# Patient Record
Sex: Female | Born: 1965 | Race: White | Hispanic: No | Marital: Married | Smoking: Never smoker
Health system: Southern US, Community
[De-identification: ages and names within clinical notes are randomized; demographics above are authoritative.]

## PROBLEM LIST (undated history)

## (undated) DIAGNOSIS — I773 Arterial fibromuscular dysplasia: Secondary | ICD-10-CM

## (undated) HISTORY — PX: WISDOM TOOTH EXTRACTION: SHX21

---

## 1996-09-26 DIAGNOSIS — G459 Transient cerebral ischemic attack, unspecified: Secondary | ICD-10-CM

## 1996-09-26 HISTORY — DX: Transient cerebral ischemic attack, unspecified: G45.9

## 2005-07-14 ENCOUNTER — Encounter: Admission: RE | Admit: 2005-07-14 | Discharge: 2005-07-14 | Payer: Self-pay | Admitting: Family Medicine

## 2006-10-24 ENCOUNTER — Encounter: Admission: RE | Admit: 2006-10-24 | Discharge: 2006-10-24 | Payer: Self-pay | Admitting: Family Medicine

## 2006-11-09 ENCOUNTER — Ambulatory Visit: Payer: Self-pay | Admitting: Family Medicine

## 2007-04-24 ENCOUNTER — Encounter: Admission: RE | Admit: 2007-04-24 | Discharge: 2007-04-24 | Payer: Self-pay | Admitting: Nephrology

## 2007-05-22 ENCOUNTER — Ambulatory Visit (HOSPITAL_COMMUNITY): Admission: RE | Admit: 2007-05-22 | Discharge: 2007-05-22 | Payer: Self-pay | Admitting: Interventional Radiology

## 2007-05-31 ENCOUNTER — Encounter: Admission: RE | Admit: 2007-05-31 | Discharge: 2007-05-31 | Payer: Self-pay | Admitting: Interventional Radiology

## 2007-12-04 ENCOUNTER — Encounter: Admission: RE | Admit: 2007-12-04 | Discharge: 2007-12-04 | Payer: Self-pay | Admitting: Family Medicine

## 2008-12-24 ENCOUNTER — Encounter: Admission: RE | Admit: 2008-12-24 | Discharge: 2008-12-24 | Payer: Self-pay | Admitting: Unknown Physician Specialty

## 2009-08-19 ENCOUNTER — Ambulatory Visit (HOSPITAL_BASED_OUTPATIENT_CLINIC_OR_DEPARTMENT_OTHER): Admission: RE | Admit: 2009-08-19 | Discharge: 2009-08-19 | Payer: Self-pay | Admitting: Surgery

## 2010-03-22 ENCOUNTER — Encounter: Admission: RE | Admit: 2010-03-22 | Discharge: 2010-03-22 | Payer: Self-pay | Admitting: Internal Medicine

## 2010-12-29 LAB — POCT I-STAT, CHEM 8
BUN: 19 mg/dL (ref 6–23)
Calcium, Ion: 1.15 mmol/L (ref 1.12–1.32)
Chloride: 106 mEq/L (ref 96–112)
Creatinine, Ser: 0.9 mg/dL (ref 0.4–1.2)
Glucose, Bld: 92 mg/dL (ref 70–99)
HCT: 46 % (ref 36.0–46.0)
Hemoglobin: 15.6 g/dL — ABNORMAL HIGH (ref 12.0–15.0)
Potassium: 4.1 mEq/L (ref 3.5–5.1)
Sodium: 140 mEq/L (ref 135–145)
TCO2: 25 mmol/L (ref 0–100)

## 2011-02-15 ENCOUNTER — Other Ambulatory Visit: Payer: Self-pay | Admitting: Obstetrics & Gynecology

## 2011-02-15 DIAGNOSIS — Z1231 Encounter for screening mammogram for malignant neoplasm of breast: Secondary | ICD-10-CM

## 2011-03-28 ENCOUNTER — Ambulatory Visit
Admission: RE | Admit: 2011-03-28 | Discharge: 2011-03-28 | Disposition: A | Discharge status: Home / Self Care | Payer: Private Health Insurance - Indemnity | Source: Ambulatory Visit | Attending: Obstetrics & Gynecology | Admitting: Obstetrics & Gynecology

## 2011-03-28 DIAGNOSIS — Z1231 Encounter for screening mammogram for malignant neoplasm of breast: Secondary | ICD-10-CM

## 2011-07-08 LAB — BASIC METABOLIC PANEL
BUN: 17
CO2: 28
Calcium: 9.2
Chloride: 106
Creatinine, Ser: 0.81
GFR calc Af Amer: >60
GFR calc non Af Amer: >60
Glucose, Bld: 105 — ABNORMAL HIGH
Potassium: 4.1
Sodium: 139

## 2011-07-08 LAB — CBC
HCT: 39.1
Hemoglobin: 13.6
MCHC: 34.9
MCV: 92.8
Platelets: 305
RBC: 4.21
RDW: 11.9
WBC: 4.9

## 2011-07-08 LAB — PROTIME-INR
INR: 1
Prothrombin Time: 13.4

## 2012-03-19 ENCOUNTER — Other Ambulatory Visit: Payer: Self-pay | Admitting: Internal Medicine

## 2012-03-19 DIAGNOSIS — Z1231 Encounter for screening mammogram for malignant neoplasm of breast: Secondary | ICD-10-CM

## 2013-04-23 ENCOUNTER — Other Ambulatory Visit: Payer: Self-pay | Admitting: Obstetrics and Gynecology

## 2013-04-23 DIAGNOSIS — Z1231 Encounter for screening mammogram for malignant neoplasm of breast: Secondary | ICD-10-CM

## 2014-04-08 ENCOUNTER — Other Ambulatory Visit: Payer: Self-pay

## 2014-04-08 DIAGNOSIS — Z1231 Encounter for screening mammogram for malignant neoplasm of breast: Secondary | ICD-10-CM

## 2014-04-16 ENCOUNTER — Encounter (INDEPENDENT_AMBULATORY_CARE_PROVIDER_SITE_OTHER): Payer: Self-pay

## 2014-04-16 ENCOUNTER — Ambulatory Visit
Admission: RE | Admit: 2014-04-16 | Discharge: 2014-04-16 | Disposition: A | Discharge status: Home / Self Care | Payer: PRIVATE HEALTH INSURANCE | Source: Ambulatory Visit

## 2014-04-16 DIAGNOSIS — Z1231 Encounter for screening mammogram for malignant neoplasm of breast: Secondary | ICD-10-CM

## 2017-11-17 ENCOUNTER — Ambulatory Visit: Payer: PRIVATE HEALTH INSURANCE

## 2017-11-17 ENCOUNTER — Ambulatory Visit (INDEPENDENT_AMBULATORY_CARE_PROVIDER_SITE_OTHER): Payer: PRIVATE HEALTH INSURANCE | Admitting: Podiatry

## 2017-11-17 DIAGNOSIS — M6740 Ganglion, unspecified site: Secondary | ICD-10-CM

## 2017-11-17 DIAGNOSIS — R52 Pain, unspecified: Secondary | ICD-10-CM

## 2017-11-17 NOTE — Progress Notes (Signed)
   Subjective:    Patient ID: Mariah RombergLeslie F Cozby, female    DOB: 11/20/1965, 52 y.o.   MRN: [id number]  HPI    Review of Systems  All other systems reviewed and are negative.      Objective:   Physical Exam        Assessment & Plan:

## 2017-11-19 NOTE — Progress Notes (Signed)
   HPI: 52 year old female presenting today as a new patient with a chief complaint of a painful nodule to the dorsum of the left foot that has been present for 6 months. Applying pressure to the area increases the pain. She has not done anything to treat the symptoms. Patient is here for further evaluation and treatment.   No past medical history on file.   Physical Exam: General: The patient is alert and oriented x3 in no acute distress.  Dermatology: Skin is warm, dry and supple bilateral lower extremities. Negative for open lesions or macerations.  Vascular: Palpable pedal pulses bilaterally. No edema or erythema noted. Capillary refill within normal limits.  Neurological: Epicritic and protective threshold grossly intact bilaterally.   Musculoskeletal Exam: Fluctuant soft tissue mass noted to dorsum of the left foot just overlying the EHL tendon. Range of motion within normal limits to all pedal and ankle joints bilateral. Muscle strength 5/5 in all groups bilateral.   Assessment: - ganglion cyst left foot   Plan of Care:  - Patient evaluated. X-Ray declined by patient.  - Foot was prepped in an aseptic manner.  - Aspiration performed after local anesthesia infiltration consisting of 3 mLs of Lidocaine 2% with an 18 gauge needle.  - Compressive dressing applied.  - Resume full activity with no restrictions. Explained that the cyst has a high chance of recurrence.  - Compression anklet dispensed.  - Return to clinic as needed.    Felecia ShellingBrent M. Nykira Reddix, DPM [facility]  Dr. Felecia ShellingBrent M. Gennell How, DPM    [address]                                        [place], [place] [address]                Office [phone]) [phone]  Fax [phone](336) [phone]

## 2018-01-30 ENCOUNTER — Ambulatory Visit: Payer: PRIVATE HEALTH INSURANCE | Admitting: Podiatry

## 2018-02-02 ENCOUNTER — Ambulatory Visit (INDEPENDENT_AMBULATORY_CARE_PROVIDER_SITE_OTHER): Payer: 59 | Admitting: Podiatry

## 2018-02-02 ENCOUNTER — Encounter

## 2018-02-02 DIAGNOSIS — M6740 Ganglion, unspecified site: Secondary | ICD-10-CM

## 2018-02-05 NOTE — Progress Notes (Signed)
   HPI: 52 year old female presenting today for follow up evaluation of a ganglion cyst of the left foot. She states the cyst has now returned and is interested in aspiration. She has not done anything to treat the symptoms. There are no modifying factors. Patient is here for further evaluation and treatment.   No past medical history on file.   Physical Exam: General: The patient is alert and oriented x3 in no acute distress.  Dermatology: Skin is warm, dry and supple bilateral lower extremities. Negative for open lesions or macerations.  Vascular: Palpable pedal pulses bilaterally. No edema or erythema noted. Capillary refill within normal limits.  Neurological: Epicritic and protective threshold grossly intact bilaterally.   Musculoskeletal Exam: Fluctuant soft tissue mass noted to dorsum of the left foot just overlying the EHL tendon. Range of motion within normal limits to all pedal and ankle joints bilateral. Muscle strength 5/5 in all groups bilateral.   Assessment: - ganglion cyst left foot - recurred    Plan of Care:  - Patient evaluated. X-Ray declined by patient.  - Injection of 0.5 mLs Celestone Soluspan injected into the ganglion cyst of the left foot. - Foot was prepped in an aseptic manner.  - Aspiration performed after local anesthesia infiltration consisting of 3 mLs of Lidocaine 2% with an 18 gauge needle.  - Compressive dressing applied.  - Resume full activity with no restrictions. Explained that the cyst has a high chance of recurrence.  - Compression anklet dispensed.  - Return to clinic as needed.   Her insurance does not cover surgery. She is hesitant to have it surgically removed.    Felecia Shelling, DPM [facility]  Dr. Felecia Shelling, DPM    [address] [place], [place] [address]                Office [phone]  Fax [phone]

## 2018-03-19 ENCOUNTER — Ambulatory Visit (INDEPENDENT_AMBULATORY_CARE_PROVIDER_SITE_OTHER): Payer: 59 | Admitting: Podiatry

## 2018-03-19 DIAGNOSIS — M6740 Ganglion, unspecified site: Secondary | ICD-10-CM

## 2018-03-21 ENCOUNTER — Ambulatory Visit: Payer: 59 | Admitting: Podiatry

## 2018-03-23 NOTE — Progress Notes (Signed)
   HPI: 52 year old female presenting today for follow up evaluation of a ganglion cyst of the left foot. She states the cyst has now returned. She is concerned because she is to go on vacation soon and will be doing a lot of walking. She denies any current pain. Patient is here for further evaluation and treatment.   No past medical history on file.   Physical Exam: General: The patient is alert and oriented x3 in no acute distress.  Dermatology: Skin is warm, dry and supple bilateral lower extremities. Negative for open lesions or macerations.  Vascular: Palpable pedal pulses bilaterally. No edema or erythema noted. Capillary refill within normal limits.  Neurological: Epicritic and protective threshold grossly intact bilaterally.   Musculoskeletal Exam: Fluctuant soft tissue mass noted to dorsum of the left foot just overlying the EHL tendon. Range of motion within normal limits to all pedal and ankle joints bilateral. Muscle strength 5/5 in all groups bilateral.   Assessment: - ganglion cyst left foot - recurred    Plan of Care:  1. Patient evaluated.  2. Pressure applied and cyst ruptured/reabsorbed into soft tissue.  3. Continue wearing compression anklet.  4. At one point, patient may need surgery, but she has financial concerns.  5. Return to clinic as needed.    Her insurance does not cover surgery. She is hesitant to have it surgically removed.    Felecia ShellingBrent M. Luqman Perrelli, DPM [facility]  Dr. Felecia ShellingBrent M. Maylynn Orzechowski, DPM    [address]                                        [place], [place] [address]                Office [phone](336) [phone]  Fax [phone](336) [phone]

## 2020-06-16 ENCOUNTER — Ambulatory Visit
Admission: RE | Admit: 2020-06-16 | Discharge: 2020-06-16 | Disposition: A | Discharge status: Home / Self Care | Payer: BLUE CROSS/BLUE SHIELD | Source: Ambulatory Visit | Attending: Emergency Medicine | Admitting: Emergency Medicine

## 2020-06-16 ENCOUNTER — Other Ambulatory Visit: Payer: Self-pay

## 2020-06-16 VITALS — BP 123/79 | HR 85 | Temp 98.4°F | Resp 18

## 2020-06-16 DIAGNOSIS — U071 COVID-19: Secondary | ICD-10-CM | POA: Diagnosis not present

## 2020-06-16 DIAGNOSIS — R0981 Nasal congestion: Secondary | ICD-10-CM

## 2020-06-16 HISTORY — DX: Arterial fibromuscular dysplasia: I77.3

## 2020-06-16 MED ORDER — BENZONATATE 100 MG PO CAPS: 100.0000 mg | ORAL_CAPSULE | Freq: Three times a day (TID) | ORAL | 0 refills | Status: AC | PRN

## 2020-06-16 NOTE — ED Provider Notes (Signed)
Mariah Hernandez    CSN: [id number] Arrival date & time: 06/16/20  1015      History   Chief Complaint Chief Complaint  Patient presents with   Sinus Problem    HPI Mariah Hernandez is a 54 y.o. female.   Patient presents with sinus pressure and congestion x1 day.  She tested COVID positive at home on 06/10/2020; symptom onset 06/07/2020.  She reports nonproductive cough and intermittent diarrhea.  No diarrhea today.  She is able to eat and drink without difficulty and feels she is able to keep yourself hydrated at home.  She denies fever, sore throat, shortness of breath, vomiting, rash, or other symptoms.  Patient received the [facility] COVID vaccine.  The history is provided by the patient.    Past Medical History:  Diagnosis Date   Fibromuscular dysplasia (HCC)    TIA (transient ischemic attack) 1998    There are no problems to display for this patient.   Past Surgical History:  Procedure Laterality Date   WISDOM TOOTH EXTRACTION      OB History   No obstetric history on file.      Home Medications    Prior to Admission medications   Medication Sig Start Date End Date Taking? Authorizing Provider  benzonatate (TESSALON) 100 MG capsule Take 1 capsule (100 mg total) by mouth 3 (three) times daily as needed for cough. 06/16/20   Mickie Bail, NP    Family History History reviewed. No pertinent family history.  Social History Social History   Tobacco Use   Smoking status: Never Smoker   Smokeless tobacco: Never Used  Substance Use Topics   Alcohol use: Yes    Comment: occasionally   Drug use: Not on file     Allergies   Amoxicillin, Demerol [meperidine hcl], and Stadol [butorphanol]   Review of Systems Review of Systems  Constitutional: Negative for chills and fever.  HENT: Positive for congestion and sinus pressure. Negative for ear pain and sore throat.   Eyes: Negative for pain and visual disturbance.  Respiratory:  Positive for cough. Negative for shortness of breath.   Cardiovascular: Negative for chest pain and palpitations.  Gastrointestinal: Positive for diarrhea. Negative for abdominal pain and vomiting.  Genitourinary: Negative for dysuria and hematuria.  Musculoskeletal: Negative for arthralgias and back pain.  Skin: Negative for color change and rash.  Neurological: Negative for seizures and syncope.  All other systems reviewed and are negative.    Physical Exam Triage Vital Signs ED Triage Vitals [06/16/20 1025]  Enc Vitals Group     BP      Pulse      Resp      Temp      Temp src      SpO2      Weight      Height      Head Circumference      Peak Flow      Pain Score 2     Pain Loc      Pain Edu?      Excl. in GC?    No data found.  Updated Vital Signs BP 123/79    Pulse 85    Temp 98.4 F (36.9 C)    Resp 18    SpO2 98%   Visual Acuity Right Eye Distance:   Left Eye Distance:   Bilateral Distance:    Right Eye Near:   Left Eye Near:    Bilateral Near:  Physical Exam Vitals and nursing note reviewed.  Constitutional:      General: She is not in acute distress.    Appearance: She is well-developed. She is not ill-appearing.  HENT:     Head: Normocephalic and atraumatic.     Right Ear: Tympanic membrane normal.     Left Ear: Tympanic membrane normal.     Nose: Congestion present.     Mouth/Throat:     Mouth: Mucous membranes are moist.     Pharynx: Oropharynx is clear.  Eyes:     Conjunctiva/sclera: Conjunctivae normal.  Cardiovascular:     Rate and Rhythm: Normal rate and regular rhythm.     Heart sounds: No murmur heard.   Pulmonary:     Effort: Pulmonary effort is normal. No respiratory distress.     Breath sounds: Normal breath sounds.  Abdominal:     Palpations: Abdomen is soft.     Tenderness: There is no abdominal tenderness. There is no guarding or rebound.  Musculoskeletal:     Cervical back: Neck supple.  Skin:    General: Skin is  warm and dry.     Findings: No rash.  Neurological:     General: No focal deficit present.     Mental Status: She is alert and oriented to person, place, and time.     Gait: Gait normal.  Psychiatric:        Mood and Affect: Mood normal.        Behavior: Behavior normal.      UC Treatments / Results  Labs (all labs ordered are listed, but only abnormal results are displayed) Labs Reviewed - No data to display  EKG   Radiology No results found.  Procedures Procedures (including critical care time)  Medications Ordered in UC Medications - No data to display  Initial Impression / Assessment and Plan / UC Course  I have reviewed the triage vital signs and the nursing notes.  Pertinent labs & imaging results that were available during my care of the patient were reviewed by me and considered in my medical decision making (see chart for details).   COVID-19.  Sinus congestion.  Treating with Mucinex, Tessalon Perles, Tylenol or ibuprofen, rest, hydration.  Instructed patient to continue her self quarantine per CDC guidelines.  Instructed her to go to the ED if she has acute worsening symptoms.  Instructed her to follow-up with her PCP if her sinus congestion is not improving.  Patient agrees to plan of care.   Final Clinical Impressions(s) / UC Diagnoses   Final diagnoses:  COVID-19  Sinus congestion     Discharge Instructions     Take plain over-the-counter Mucinex as directed.  Take the [facility] as needed for cough.  Take Tylenol or ibuprofen as needed for fever or discomfort.  You should self-quarantine for:  *10 days since your symptoms first appeared and  *24 hours with no fever, without the use of fever-reducing medications and  *your other symptoms of COVID are improving.  Most people do not need to be re-tested at the end of the quarantine period.    Go to the emergency department if you have high fever not relieved by Tylenol, shortness of breath,  severe diarrhea, or other concerning symptoms.       ED Prescriptions    Medication Sig Dispense Auth. Provider   benzonatate (TESSALON) 100 MG capsule Take 1 capsule (100 mg total) by mouth 3 (three) times daily as needed for cough. 21  capsule Mickie Bail, NP     PDMP not reviewed this encounter.   Mickie Bail, NP 06/16/20 1054

## 2020-06-16 NOTE — ED Triage Notes (Signed)
Patient reports that she did an at home covid test on 9/15 which was positive. Reports that she is having sinus pressure that is worse when she bends over.

## 2020-06-16 NOTE — Discharge Instructions (Signed)
Take plain over-the-counter Mucinex as directed.  Take the [facility] as needed for cough.  Take Tylenol or ibuprofen as needed for fever or discomfort.  You should self-quarantine for:  *10 days since your symptoms first appeared and  *24 hours with no fever, without the use of fever-reducing medications and  *your other symptoms of COVID are improving.  Most people do not need to be re-tested at the end of the quarantine period.    Go to the emergency department if you have high fever not relieved by Tylenol, shortness of breath, severe diarrhea, or other concerning symptoms.

## 2021-03-30 ENCOUNTER — Ambulatory Visit: Payer: BLUE CROSS/BLUE SHIELD | Admitting: Occupational Therapy

## 2021-04-20 ENCOUNTER — Ambulatory Visit: Payer: BLUE CROSS/BLUE SHIELD | Admitting: Occupational Therapy

## 2021-04-21 ENCOUNTER — Ambulatory Visit: Payer: BLUE CROSS/BLUE SHIELD | Attending: Internal Medicine | Admitting: Occupational Therapy

## 2021-04-21 DIAGNOSIS — M25642 Stiffness of left hand, not elsewhere classified: Secondary | ICD-10-CM | POA: Insufficient documentation

## 2021-04-21 NOTE — Therapy (Signed)
[place] [facility]INE [address], [place], [address] Phone: [phone]   Fax:  [phone]  Occupational Therapy Screen:  Patient Details  Name: Mariah Hernandez MRN: [id number] Date of Birth: 1965-12-25 No data recorded  Encounter Date: 04/21/2021   OT End of Session - 04/21/21 1607     Visit Number 0             Past Medical History:  Diagnosis Date   Fibromuscular dysplasia (HCC)    TIA (transient ischemic attack) 1998    Past Surgical History:  Procedure Laterality Date   WISDOM TOOTH EXTRACTION      There were no vitals filed for this visit.   Subjective Assessment - 04/21/21 1605     Subjective  I cut my finger in Febr with hedge cutter -and it was deep but did clean it and did some sterristrips- and kept it  straight for about month in something like splint - but now it is soooo stiff and cannot use my index finger - and get red at times               [facility] OT Assessment - 04/21/21 0001       Left Hand AROM   L Index  MCP 0-90 95 Degrees    L Index PIP 0-100 75 Degrees   blocked intrinsic 60 , increase to 90 in session   L Index DIP 0-70 --   no ext notice , keep in slight flexion 25 degrees                Pt arrive with hx of cut to dorsal DIP of 2nd digit- L hand in Febr 22 - pt cleaned it and sterristrips -and then kept it in splint of something for month  Now with severe stiffness at DIP flexion - and coming in at PIP  Scar across - and adhere - pt ed on scar massage  And then PROM block to DIP  Intrinsic fist - DIP/PIP stretch 30 sec x 3  Followed by block intrinsic fist   Composite fist with pen in hand at [facility] - to block MC at 90 10 reps  3-5 x day - To do for 2 wks and follow up if needed                                   Visit Diagnosis: Stiffness of left hand, not elsewhere classified    Problem List There are no problems to  display for this patient.   Oletta Cohn OTR/L,CLT 04/21/2021, 4:08 PM  [place] [facility] AND SPORTS MEDICINE [address], [place], [address] Phone: [phone]   Fax:  [phone]  Name: DALAYSIA HARMS MRN: [id number] Date of Birth: [birth year]

## 2021-05-05 ENCOUNTER — Ambulatory Visit: Payer: BLUE CROSS/BLUE SHIELD | Attending: Internal Medicine | Admitting: Occupational Therapy

## 2021-11-25 ENCOUNTER — Ambulatory Visit: Payer: 59 | Admitting: Obstetrics and Gynecology

## 2022-02-01 ENCOUNTER — Other Ambulatory Visit: Payer: Self-pay | Admitting: Family

## 2022-02-01 DIAGNOSIS — Z1231 Encounter for screening mammogram for malignant neoplasm of breast: Secondary | ICD-10-CM

## 2022-03-07 ENCOUNTER — Ambulatory Visit
Admission: RE | Admit: 2022-03-07 | Discharge: 2022-03-07 | Disposition: A | Discharge status: Home / Self Care | Payer: BLUE CROSS/BLUE SHIELD | Source: Ambulatory Visit | Attending: Family | Admitting: Family

## 2022-03-07 DIAGNOSIS — Z1231 Encounter for screening mammogram for malignant neoplasm of breast: Secondary | ICD-10-CM | POA: Insufficient documentation

## 2022-10-13 IMAGING — MG DIGITAL SCREENING BREAST BILAT IMPLANT W/ TOMO W/ CAD
8 of 14 series · 8 of 34 positions shown · non-contrast
Comparison: Previous exam(s).

CLINICAL DATA: Screening.

EXAM:
DIGITAL SCREENING BILATERAL MAMMOGRAM WITH IMPLANTS, CAD AND
TOMOSYNTHESIS
TECHNIQUE: Bilateral screening digital craniocaudal and mediolateral oblique
mammograms were obtained. Bilateral screening digital breast
tomosynthesis was performed. The images were evaluated with
computer-aided detection. Standard and/or implant displaced views
were performed.

[R MLO]
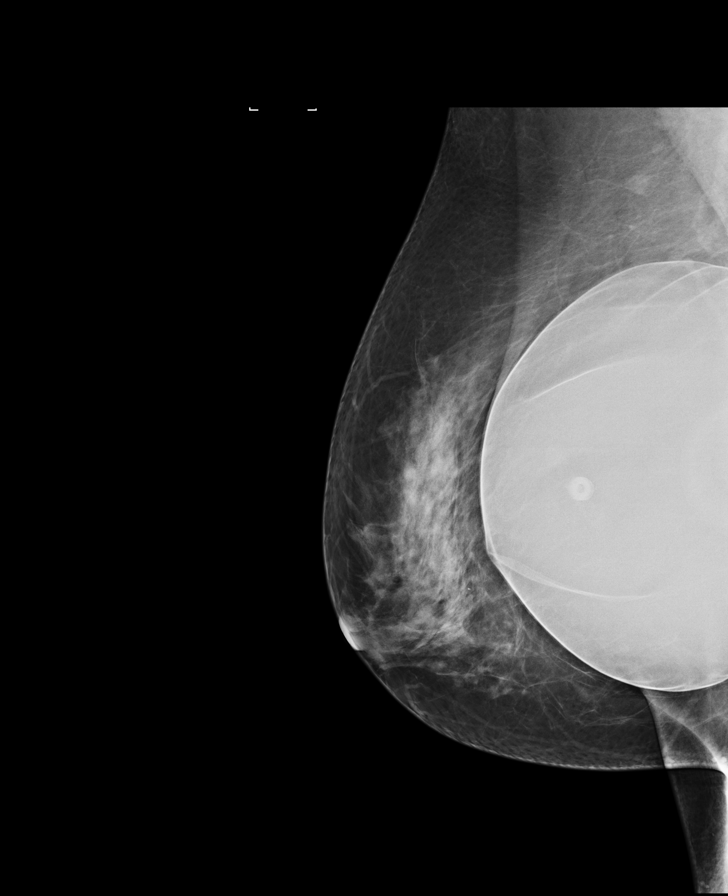

[L MLO]
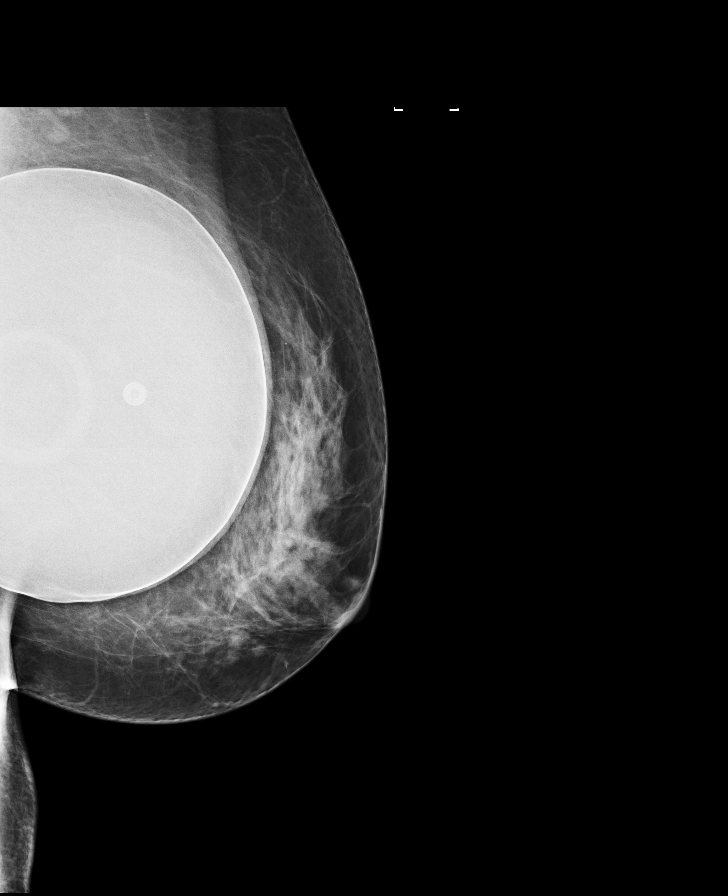

[R CC]
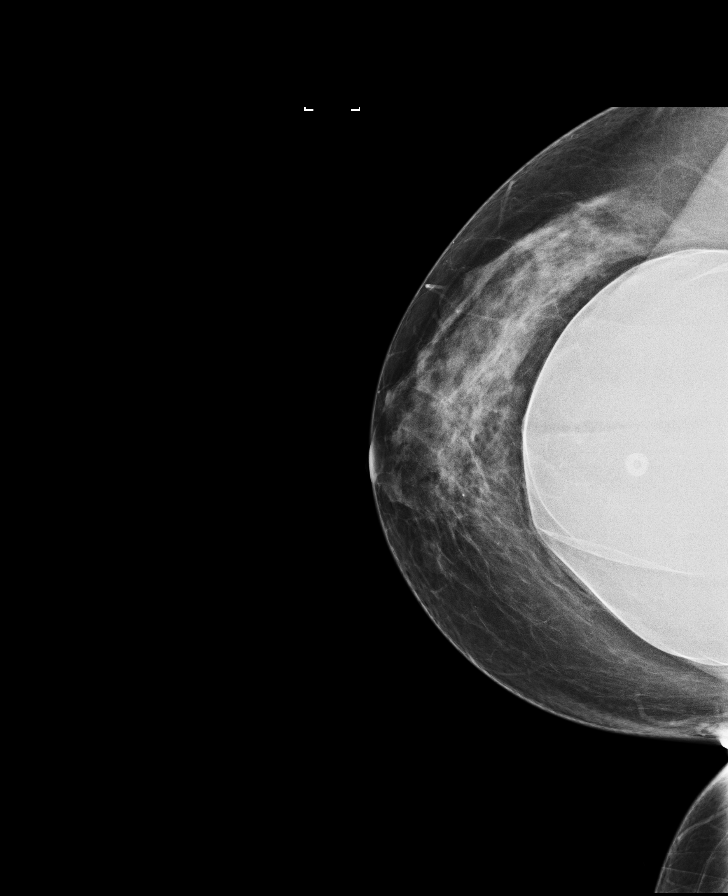

[L CC]
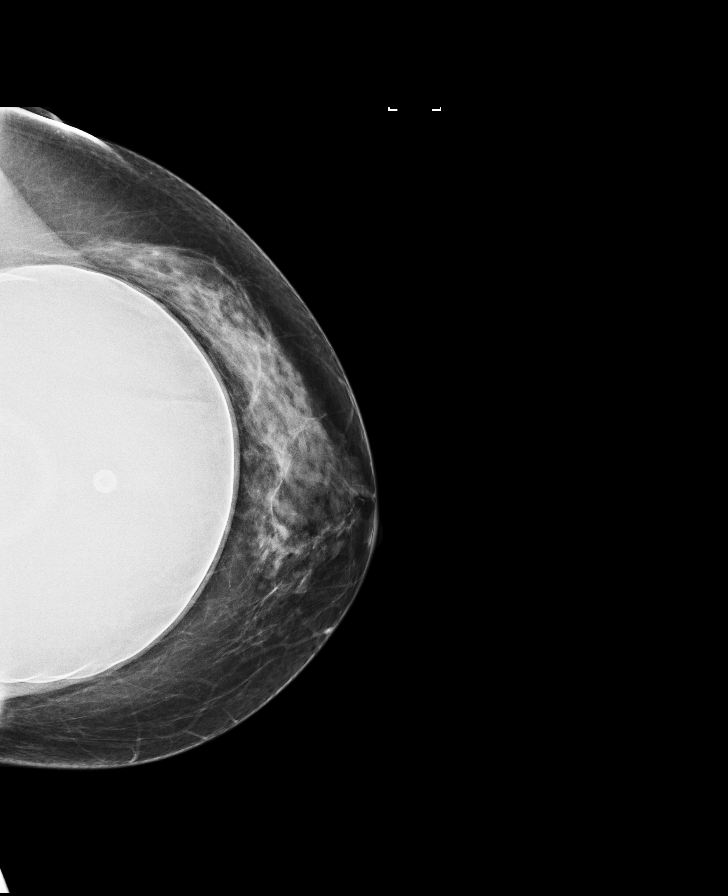

[L CC synth-2D]
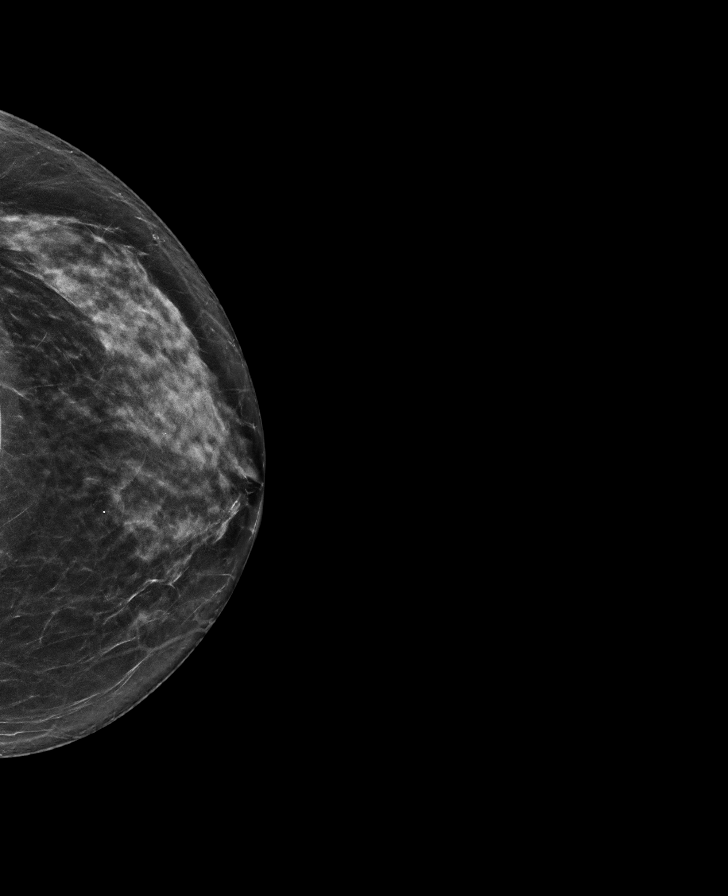

[R MLO synth-2D]
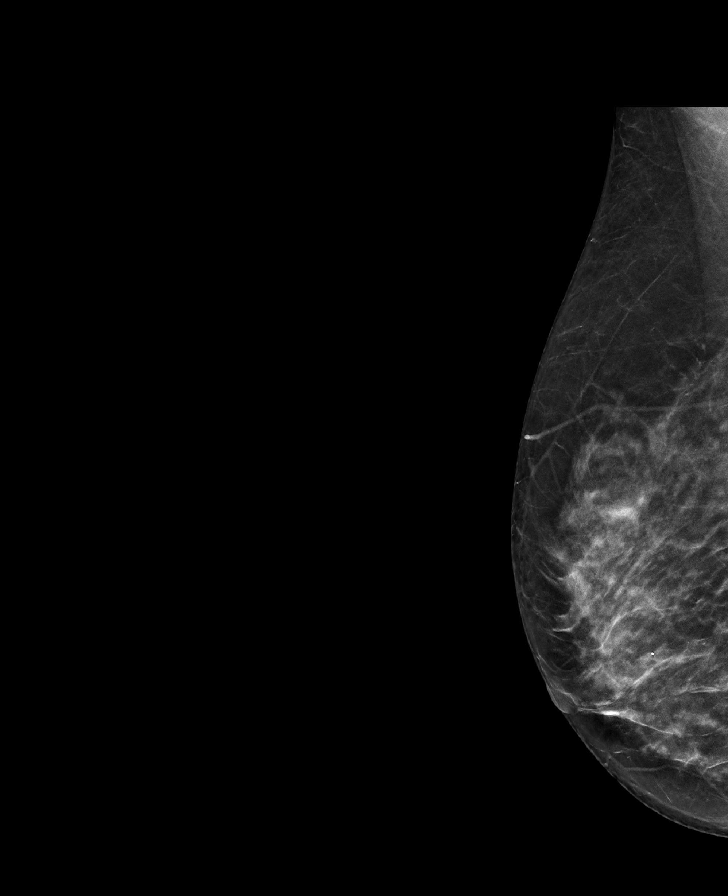

[L MLO synth-2D]
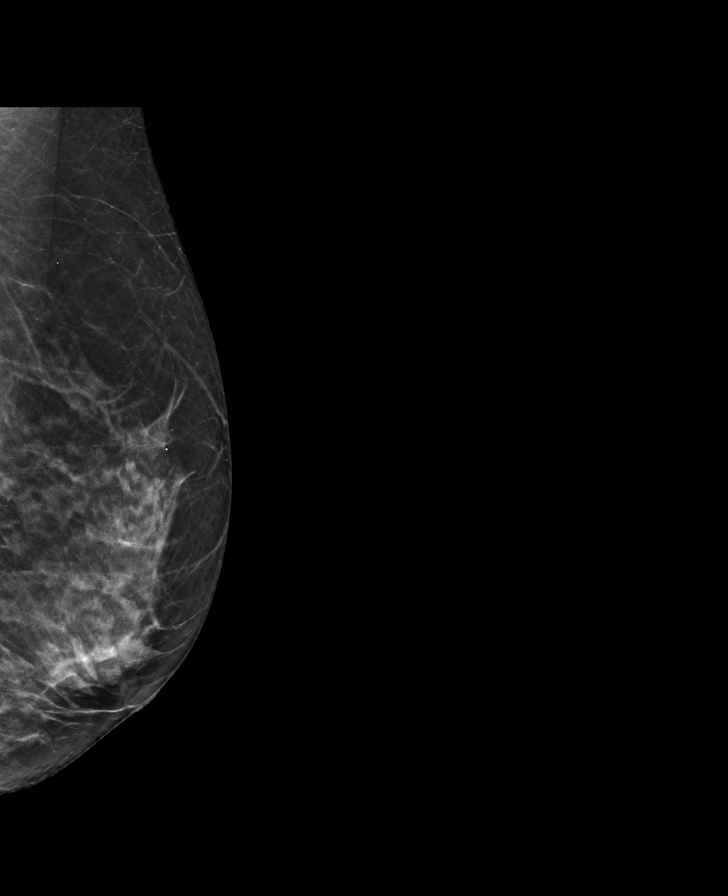

[R CC synth-2D]
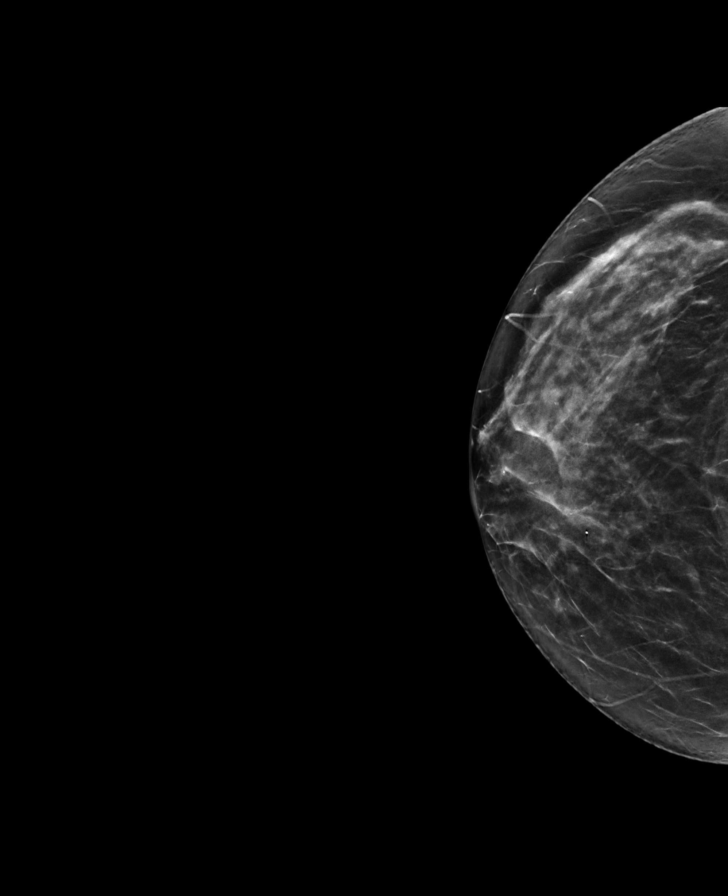

[8 of 34 positions shown; findings below may reference images not displayed]

ACR Breast Density Category c: The breast tissue is heterogeneously
dense, which may obscure small masses.
FINDINGS: The patient has retropectoral implants. There are no findings
suspicious for malignancy.
IMPRESSION: No mammographic evidence of malignancy. A result letter of this
screening mammogram will be mailed directly to the patient.

RECOMMENDATION:
Screening mammogram in one year. (Code:LT-E-7TH)

BI-RADS CATEGORY  1:  Negative.

## 2023-08-18 ENCOUNTER — Ambulatory Visit: Payer: BLUE CROSS/BLUE SHIELD | Admitting: Obstetrics

## 2023-08-18 ENCOUNTER — Encounter: Payer: Self-pay | Admitting: Obstetrics

## 2023-08-18 VITALS — BP 130/80 | HR 92 | Ht 66.0 in | Wt 176.0 lb

## 2023-08-18 DIAGNOSIS — N898 Other specified noninflammatory disorders of vagina: Secondary | ICD-10-CM | POA: Diagnosis not present

## 2023-08-18 DIAGNOSIS — L309 Dermatitis, unspecified: Secondary | ICD-10-CM

## 2023-08-18 MED ORDER — CLOBETASOL PROPIONATE 0.05 % EX OINT: 1.0000 | TOPICAL_OINTMENT | Freq: Two times a day (BID) | CUTANEOUS | 1 refills | Status: AC

## 2023-08-18 NOTE — Progress Notes (Signed)
    GYNECOLOGY PROGRESS NOTE  Subjective:  PCP: Pcp, No  Patient ID: Mariah Hernandez, female    DOB: September 18, 1966, 57 y.o.   MRN: [id number]  HPI  Patient is a 57 y.o. G2P2 female who presents for vulvar irritation, burning, red, itchy skin, also a white small bump at the top left labia. Redness/irritation go all the way to her buttocks. This comes and goes for the past several years, guessing approx 66yrs. Episodes will last a few days, but this current time is lasting 1 week. She uses coconut oil and other essential oils and feels that helps her symptoms. Is finally tired of having these episodes come and go so decided to come in for eval. States she usually sees [facility], but unavailable today.   The following portions of the patient's history were reviewed and updated as appropriate: allergies, current medications, past family history, past medical history, past social history, past surgical history, and problem list.  Review of Systems Pertinent items are noted in HPI.   Objective:   Blood pressure 130/80, pulse 92, height 5\' 6"  (1.676 m), weight 176 lb (79.8 kg). Body mass index is 28.41 kg/m.  General appearance: alert and cooperative Abdomen: soft, non-tender; bowel sounds normal; no masses,  no organomegaly Respiratory: normal work of breathing  Pelvic: external genitalia with white, atrophic patches bilaterally near clitoris; erythematous patches extending into the posterior fourchette; perineum/buttocks with well-demarcated, erythematous patches with flaking, white borders, papery appearance. See images in chart.  Extremities: extremities normal, atraumatic, no cyanosis or edema Neurologic: Grossly normal  Assessment/Plan:   1. Vulvar dermatitis   2. Vaginal itching    Based on appearance and symptoms, suspect lichen sclerosus.  -Trial Clobetasol BID  -Discussed hygiene and clothing precautions -May need biopsy in future, this was discussed, pt aware. -RTC 4wks for  progress eval, sooner prn   Julieanne Manson, DO [facility]

## 2023-09-08 NOTE — Progress Notes (Deleted)
    GYNECOLOGY PROGRESS NOTE  Subjective:  PCP: Pcp, No  Patient ID: Mariah Hernandez, female    DOB: 09-Nov-1965, 57 y.o.   MRN: [id number]  HPI Patient is a 57 y.o. G2P2 female who presents for follow up on suspect lichen sclerosus, she was put on Trial Clobetasol BID . Was told to follow up in 4 weeks.    {Common ambulatory SmartLinks:19316}  Review of Systems {ros; complete:30496}   Objective:   There were no vitals taken for this visit. There is no height or weight on file to calculate BMI.  General appearance: {general exam:16600} Abdomen: {abdominal exam:16834} Pelvic: {pelvic exam:16852::"cervix normal in appearance","external genitalia normal","no adnexal masses or tenderness","no cervical motion tenderness","rectovaginal septum normal","uterus normal size, shape, and consistency","vagina normal without discharge"} Extremities: {extremity exam:5109} Neurologic: {neuro exam:17854}   Assessment/Plan:   No diagnosis found.   There are no diagnoses linked to this encounter.     Julieanne Manson, DO [facility]

## 2023-09-12 ENCOUNTER — Ambulatory Visit: Payer: BLUE CROSS/BLUE SHIELD | Admitting: Obstetrics

## 2023-09-29 NOTE — Progress Notes (Deleted)
    GYNECOLOGY PROGRESS NOTE  Subjective:  PCP: Pcp, No  Patient ID: Mariah Hernandez, female    DOB: 03-12-1966, 58 y.o.   MRN: [id number]  HPI  Patient is a 58 y.o. G2P2 female who presents for follow up on lichen sclerosus. Pt was prescribed Clobetasol  BID. Pt states that   {Common ambulatory SmartLinks:19316}  Review of Systems {ros; complete:30496}   Objective:   There were no vitals taken for this visit. There is no height or weight on file to calculate BMI.  General appearance: {general exam:16600} Abdomen: {abdominal exam:16834} Pelvic: {pelvic exam:16852::cervix normal in appearance,external genitalia normal,no adnexal masses or tenderness,no cervical motion tenderness,rectovaginal septum normal,uterus normal size, shape, and consistency,vagina normal without discharge} Extremities: {extremity exam:5109} Neurologic: {neuro exam:17854}   Assessment/Plan:   No diagnosis found.   There are no diagnoses linked to this encounter.     Estil Mangle, DO [facility]

## 2023-10-02 ENCOUNTER — Ambulatory Visit: Payer: BLUE CROSS/BLUE SHIELD | Admitting: Obstetrics

## 2023-10-06 NOTE — Progress Notes (Deleted)
    GYNECOLOGY PROGRESS NOTE  Subjective:  PCP: Pcp, No  Patient ID: Mariah Hernandez, female    DOB: December 22, 1965, 58 y.o.   MRN: [id number]  HPI  Patient is a 58 y.o. G2P2 female who presents for follow up from 08/18/23 for Vulvar Dermatitis and Vaginal Itching, she was advised to use Clobetasol  BID at the last visit.    {Common ambulatory SmartLinks:19316}  Review of Systems {ros; complete:30496}   Objective:   There were no vitals taken for this visit. There is no height or weight on file to calculate BMI.  General appearance: {general exam:16600} Abdomen: {abdominal exam:16834} Pelvic: {pelvic exam:16852::cervix normal in appearance,external genitalia normal,no adnexal masses or tenderness,no cervical motion tenderness,rectovaginal septum normal,uterus normal size, shape, and consistency,vagina normal without discharge} Extremities: {extremity exam:5109} Neurologic: {neuro exam:17854}   Assessment/Plan:   No diagnosis found.   There are no diagnoses linked to this encounter.     Estil Mangle, DO [facility]

## 2023-10-09 ENCOUNTER — Ambulatory Visit: Payer: BLUE CROSS/BLUE SHIELD | Admitting: Obstetrics

## 2023-10-16 NOTE — Progress Notes (Deleted)
    GYNECOLOGY PROGRESS NOTE  Subjective:  PCP: Pcp, No  Patient ID: Mariah Hernandez, female    DOB: Feb 21, 1966, 58 y.o.   MRN: [id number]  HPI  Patient is a 58 y.o. G2P2 female who presents for follow up from Vulvar dermatitis, pt was advised to use  Clobetasol BID.   {Common ambulatory SmartLinks:19316}  Review of Systems {ros; complete:30496}   Objective:   There were no vitals taken for this visit. There is no height or weight on file to calculate BMI.  General appearance: {general exam:16600} Abdomen: {abdominal exam:16834} Pelvic: {pelvic exam:16852::"cervix normal in appearance","external genitalia normal","no adnexal masses or tenderness","no cervical motion tenderness","rectovaginal septum normal","uterus normal size, shape, and consistency","vagina normal without discharge"} Extremities: {extremity exam:5109} Neurologic: {neuro exam:17854}   Assessment/Plan:   No diagnosis found.   There are no diagnoses linked to this encounter.     Julieanne Manson, DO [facility]

## 2023-10-17 ENCOUNTER — Ambulatory Visit: Payer: BLUE CROSS/BLUE SHIELD | Admitting: Obstetrics

## 2023-10-17 DIAGNOSIS — L309 Dermatitis, unspecified: Secondary | ICD-10-CM

## 2023-10-20 NOTE — Progress Notes (Deleted)
   GYNECOLOGY PROGRESS NOTE  Subjective:  PCP: Pcp, No  Patient ID: Mariah Hernandez, female    DOB: July 12, 1966, 58 y.o.   MRN: [id number]  HPI  Patient is a 58 y.o. G2P2 female who presents for follow up from Vulvar dermatitis, pt was advised to use  Clobetasol BID.   {Common ambulatory SmartLinks:19316}  Review of Systems {ros; complete:30496}   Objective:   There were no vitals taken for this visit. There is no height or weight on file to calculate BMI.  General appearance: {general exam:16600} Abdomen: {abdominal exam:16834} Pelvic: {pelvic exam:16852::"cervix normal in appearance","external genitalia normal","no adnexal masses or tenderness","no cervical motion tenderness","rectovaginal septum normal","uterus normal size, shape, and consistency","vagina normal without discharge"} Extremities: {extremity exam:5109} Neurologic: {neuro exam:17854}   Assessment/Plan:   No diagnosis found.   There are no diagnoses linked to this encounter.     Julieanne Manson, DO [facility]

## 2023-10-23 ENCOUNTER — Ambulatory Visit: Payer: 59 | Admitting: Obstetrics

## 2023-10-23 DIAGNOSIS — N898 Other specified noninflammatory disorders of vagina: Secondary | ICD-10-CM

## 2023-10-23 DIAGNOSIS — L309 Dermatitis, unspecified: Secondary | ICD-10-CM

## 2023-10-25 ENCOUNTER — Encounter: Payer: Self-pay | Admitting: Obstetrics

## 2024-01-16 DIAGNOSIS — R635 Abnormal weight gain: Secondary | ICD-10-CM | POA: Diagnosis not present

## 2024-01-16 DIAGNOSIS — Z8673 Personal history of transient ischemic attack (TIA), and cerebral infarction without residual deficits: Secondary | ICD-10-CM | POA: Diagnosis not present

## 2024-01-16 DIAGNOSIS — Z6827 Body mass index (BMI) 27.0-27.9, adult: Secondary | ICD-10-CM | POA: Diagnosis not present

## 2024-01-16 DIAGNOSIS — N951 Menopausal and female climacteric states: Secondary | ICD-10-CM | POA: Diagnosis not present

## 2024-01-18 DIAGNOSIS — N898 Other specified noninflammatory disorders of vagina: Secondary | ICD-10-CM | POA: Diagnosis not present

## 2024-01-18 DIAGNOSIS — R6882 Decreased libido: Secondary | ICD-10-CM | POA: Diagnosis not present

## 2024-01-18 DIAGNOSIS — E7800 Pure hypercholesterolemia, unspecified: Secondary | ICD-10-CM | POA: Diagnosis not present

## 2024-01-18 DIAGNOSIS — Z1339 Encounter for screening examination for other mental health and behavioral disorders: Secondary | ICD-10-CM | POA: Diagnosis not present

## 2024-01-18 DIAGNOSIS — N951 Menopausal and female climacteric states: Secondary | ICD-10-CM | POA: Diagnosis not present

## 2024-01-18 DIAGNOSIS — R635 Abnormal weight gain: Secondary | ICD-10-CM | POA: Diagnosis not present

## 2024-01-18 DIAGNOSIS — Z1331 Encounter for screening for depression: Secondary | ICD-10-CM | POA: Diagnosis not present

## 2024-01-18 DIAGNOSIS — G479 Sleep disorder, unspecified: Secondary | ICD-10-CM | POA: Diagnosis not present

## 2024-01-18 DIAGNOSIS — E663 Overweight: Secondary | ICD-10-CM | POA: Diagnosis not present

## 2024-02-29 DIAGNOSIS — G479 Sleep disorder, unspecified: Secondary | ICD-10-CM | POA: Diagnosis not present

## 2024-02-29 DIAGNOSIS — Z6828 Body mass index (BMI) 28.0-28.9, adult: Secondary | ICD-10-CM | POA: Diagnosis not present
